# Patient Record
Sex: Female | Born: 1975 | Race: Black or African American | Hispanic: No | Marital: Single | Smoking: Current every day smoker
Health system: Southern US, Community
[De-identification: ages and names within clinical notes are randomized; demographics above are authoritative.]

## PROBLEM LIST (undated history)

## (undated) DIAGNOSIS — F419 Anxiety disorder, unspecified: Secondary | ICD-10-CM

## (undated) DIAGNOSIS — E669 Obesity, unspecified: Secondary | ICD-10-CM

## (undated) HISTORY — PX: CHOLECYSTECTOMY: SHX55

## (undated) HISTORY — PX: HERNIA REPAIR: SHX51

---

## 2015-07-03 ENCOUNTER — Encounter (HOSPITAL_COMMUNITY): Payer: Self-pay | Admitting: Emergency Medicine

## 2015-07-03 DIAGNOSIS — Z3202 Encounter for pregnancy test, result negative: Secondary | ICD-10-CM | POA: Diagnosis not present

## 2015-07-03 DIAGNOSIS — K5900 Constipation, unspecified: Secondary | ICD-10-CM | POA: Diagnosis not present

## 2015-07-03 DIAGNOSIS — K429 Umbilical hernia without obstruction or gangrene: Secondary | ICD-10-CM | POA: Diagnosis not present

## 2015-07-03 DIAGNOSIS — E669 Obesity, unspecified: Secondary | ICD-10-CM

## 2015-07-03 DIAGNOSIS — R109 Unspecified abdominal pain: Secondary | ICD-10-CM | POA: Insufficient documentation

## 2015-07-03 DIAGNOSIS — F17200 Nicotine dependence, unspecified, uncomplicated: Secondary | ICD-10-CM | POA: Insufficient documentation

## 2015-07-03 DIAGNOSIS — B349 Viral infection, unspecified: Secondary | ICD-10-CM | POA: Diagnosis not present

## 2015-07-03 LAB — CBC
HCT: 34.8 % — ABNORMAL LOW (ref 36.0–46.0)
Hemoglobin: 11 g/dL — ABNORMAL LOW (ref 12.0–15.0)
MCH: 26.5 pg (ref 26.0–34.0)
MCHC: 31.6 g/dL (ref 30.0–36.0)
MCV: 83.9 fL (ref 78.0–100.0)
PLATELETS: 508 10*3/uL — AB (ref 150–400)
RBC: 4.15 MIL/uL (ref 3.87–5.11)
RDW: 14.9 % (ref 11.5–15.5)
WBC: 12.8 10*3/uL — ABNORMAL HIGH (ref 4.0–10.5)

## 2015-07-03 LAB — COMPREHENSIVE METABOLIC PANEL
ALK PHOS: 52 U/L (ref 38–126)
ALT: 12 U/L — AB (ref 14–54)
AST: 18 U/L (ref 15–41)
Albumin: 3.6 g/dL (ref 3.5–5.0)
Anion gap: 8 (ref 5–15)
BILIRUBIN TOTAL: 0.4 mg/dL (ref 0.3–1.2)
BUN: <5 mg/dL — ABNORMAL LOW (ref 6–20)
CALCIUM: 9.5 mg/dL (ref 8.9–10.3)
CO2: 23 mmol/L (ref 22–32)
CREATININE: 0.71 mg/dL (ref 0.44–1.00)
Chloride: 103 mmol/L (ref 101–111)
Glucose, Bld: 127 mg/dL — ABNORMAL HIGH (ref 65–99)
Potassium: 3.9 mmol/L (ref 3.5–5.1)
SODIUM: 134 mmol/L — AB (ref 135–145)
Total Protein: 7.6 g/dL (ref 6.5–8.1)

## 2015-07-03 LAB — URINE MICROSCOPIC-ADD ON: WBC, UA: NONE SEEN WBC/hpf (ref 0–5)

## 2015-07-03 LAB — LIPASE, BLOOD: Lipase: 23 U/L (ref 11–51)

## 2015-07-03 LAB — URINALYSIS, ROUTINE W REFLEX MICROSCOPIC
BILIRUBIN URINE: NEGATIVE
Glucose, UA: NEGATIVE mg/dL
Ketones, ur: NEGATIVE mg/dL
Leukocytes, UA: NEGATIVE
Nitrite: NEGATIVE
PROTEIN: NEGATIVE mg/dL
Specific Gravity, Urine: 1.025 (ref 1.005–1.030)
pH: 6 (ref 5.0–8.0)

## 2015-07-03 LAB — POC URINE PREG, ED: PREG TEST UR: NEGATIVE

## 2015-07-03 NOTE — ED Notes (Signed)
Pt. reports mid abdominal pain onset yesterday , denies nausea or vomitting , no diarrhea or fever , pt. suspects pain stems from her hernia .

## 2015-07-04 ENCOUNTER — Emergency Department (HOSPITAL_COMMUNITY)
Admission: EM | Admit: 2015-07-04 | Discharge: 2015-07-04 | Disposition: A | Discharge status: Home / Self Care | Payer: Medicaid Other | Source: Home / Self Care

## 2015-07-04 ENCOUNTER — Encounter (HOSPITAL_COMMUNITY): Payer: Self-pay | Admitting: *Deleted

## 2015-07-04 DIAGNOSIS — B349 Viral infection, unspecified: Secondary | ICD-10-CM | POA: Insufficient documentation

## 2015-07-04 DIAGNOSIS — Z3202 Encounter for pregnancy test, result negative: Secondary | ICD-10-CM | POA: Insufficient documentation

## 2015-07-04 DIAGNOSIS — K429 Umbilical hernia without obstruction or gangrene: Secondary | ICD-10-CM | POA: Insufficient documentation

## 2015-07-04 DIAGNOSIS — K5900 Constipation, unspecified: Secondary | ICD-10-CM | POA: Insufficient documentation

## 2015-07-04 DIAGNOSIS — F17200 Nicotine dependence, unspecified, uncomplicated: Secondary | ICD-10-CM | POA: Insufficient documentation

## 2015-07-04 DIAGNOSIS — E669 Obesity, unspecified: Secondary | ICD-10-CM | POA: Insufficient documentation

## 2015-07-04 HISTORY — DX: Obesity, unspecified: E66.9

## 2015-07-04 LAB — LIPASE, BLOOD: Lipase: 28 U/L (ref 11–51)

## 2015-07-04 LAB — COMPREHENSIVE METABOLIC PANEL
ALK PHOS: 50 U/L (ref 38–126)
ALT: 12 U/L — ABNORMAL LOW (ref 14–54)
ANION GAP: 7 (ref 5–15)
AST: 16 U/L (ref 15–41)
Albumin: 3.6 g/dL (ref 3.5–5.0)
BILIRUBIN TOTAL: 0.4 mg/dL (ref 0.3–1.2)
BUN: <5 mg/dL — ABNORMAL LOW (ref 6–20)
CALCIUM: 9.3 mg/dL (ref 8.9–10.3)
CO2: 26 mmol/L (ref 22–32)
Chloride: 103 mmol/L (ref 101–111)
Creatinine, Ser: 0.73 mg/dL (ref 0.44–1.00)
GFR calc non Af Amer: >60 mL/min (ref >60)
Glucose, Bld: 116 mg/dL — ABNORMAL HIGH (ref 65–99)
Potassium: 3.7 mmol/L (ref 3.5–5.1)
Sodium: 136 mmol/L (ref 135–145)
TOTAL PROTEIN: 7.7 g/dL (ref 6.5–8.1)

## 2015-07-04 LAB — CBC
HCT: 35 % — ABNORMAL LOW (ref 36.0–46.0)
HEMOGLOBIN: 11.1 g/dL — AB (ref 12.0–15.0)
MCH: 26.9 pg (ref 26.0–34.0)
MCHC: 31.7 g/dL (ref 30.0–36.0)
MCV: 84.7 fL (ref 78.0–100.0)
Platelets: 476 10*3/uL — ABNORMAL HIGH (ref 150–400)
RBC: 4.13 MIL/uL (ref 3.87–5.11)
RDW: 14.8 % (ref 11.5–15.5)
WBC: 9.2 10*3/uL (ref 4.0–10.5)

## 2015-07-04 LAB — URINE MICROSCOPIC-ADD ON

## 2015-07-04 LAB — URINALYSIS, ROUTINE W REFLEX MICROSCOPIC
BILIRUBIN URINE: NEGATIVE
Glucose, UA: NEGATIVE mg/dL
Ketones, ur: NEGATIVE mg/dL
LEUKOCYTES UA: NEGATIVE
NITRITE: NEGATIVE
Protein, ur: NEGATIVE mg/dL
SPECIFIC GRAVITY, URINE: 1.026 (ref 1.005–1.030)
pH: 7 (ref 5.0–8.0)

## 2015-07-04 NOTE — ED Notes (Signed)
Pts name called for a room no answer 

## 2015-07-04 NOTE — ED Notes (Signed)
Pt c/o abd pain x 2 days. Pt think its her hernia. Hx of hernia repaired. Was here to be seen yesterday but wait was too long.

## 2015-07-05 ENCOUNTER — Emergency Department (HOSPITAL_COMMUNITY)
Admission: EM | Admit: 2015-07-05 | Discharge: 2015-07-05 | Disposition: A | Discharge status: Home / Self Care | Payer: Medicaid Other | Attending: Emergency Medicine | Admitting: Emergency Medicine

## 2015-07-05 ENCOUNTER — Encounter (HOSPITAL_COMMUNITY): Payer: Self-pay | Admitting: Radiology

## 2015-07-05 ENCOUNTER — Emergency Department (HOSPITAL_COMMUNITY): Payer: Medicaid Other

## 2015-07-05 DIAGNOSIS — B349 Viral infection, unspecified: Secondary | ICD-10-CM

## 2015-07-05 DIAGNOSIS — R1033 Periumbilical pain: Secondary | ICD-10-CM

## 2015-07-05 DIAGNOSIS — K429 Umbilical hernia without obstruction or gangrene: Secondary | ICD-10-CM

## 2015-07-05 LAB — PREGNANCY, URINE: PREG TEST UR: NEGATIVE

## 2015-07-05 MED ORDER — PROCHLORPERAZINE EDISYLATE 5 MG/ML IJ SOLN
10.0000 mg | Freq: Four times a day (QID) | INTRAMUSCULAR | Status: DC | PRN
  Administered 2015-07-05: 10 mg via INTRAVENOUS
  Filled 2015-07-05: qty 2

## 2015-07-05 MED ORDER — DIPHENHYDRAMINE HCL 50 MG/ML IJ SOLN
25.0000 mg | Freq: Once | INTRAMUSCULAR | Status: AC
  Administered 2015-07-05: 25 mg via INTRAVENOUS
  Filled 2015-07-05: qty 1

## 2015-07-05 MED ORDER — SODIUM CHLORIDE 0.9 % IV BOLUS (SEPSIS)
1000.0000 mL | Freq: Once | INTRAVENOUS | Status: AC
  Administered 2015-07-05: 1000 mL via INTRAVENOUS

## 2015-07-05 MED ORDER — FENTANYL CITRATE (PF) 100 MCG/2ML IJ SOLN
50.0000 ug | Freq: Once | INTRAMUSCULAR | Status: AC
  Administered 2015-07-05: 50 ug via INTRAVENOUS
  Filled 2015-07-05: qty 2

## 2015-07-05 MED ORDER — ONDANSETRON HCL 4 MG/2ML IJ SOLN
4.0000 mg | Freq: Once | INTRAMUSCULAR | Status: AC
Start: 2015-07-05 — End: 2015-07-05
  Administered 2015-07-05: 4 mg via INTRAVENOUS
  Filled 2015-07-05: qty 2

## 2015-07-05 MED ORDER — IOHEXOL 300 MG/ML  SOLN
100.0000 mL | Freq: Once | INTRAMUSCULAR | Status: AC | PRN
  Administered 2015-07-05: 100 mL via INTRAVENOUS

## 2015-07-05 NOTE — Discharge Instructions (Signed)

## 2015-07-05 NOTE — ED Provider Notes (Signed)
CSN: [id number]     Arrival date & time 07/04/15  2131 History  By signing my name below, I, Rohini Rajnarayanan, attest that this documentation has been prepared under the direction and in the presence of Alvira MondayErin Seanmichael Salmons, MD Electronically Signed: Charlean Merlohini Rajnarayanan, ED Scribe 07/05/2015 at 3:04 AM.  Chief Complaint  Patient presents with  . Abdominal Pain   The history is provided by the patient. No language interpreter was used.    HPI Comments: Destiny White is a 40 y.o. female with a pmshx of cholecystectomy, and a hernia repair, who presents to the Emergency Department complaining of intermittent, throbbing, 8/10 mid-abdominal pain with keloids onset 2 days ago. Pt also reports associated HA onset earlier today, chills, cough, and a potential subjective fever. Pt has not had a bowel movement today. Pain is exacerbated by standing up. Pt has a hx of a hernia repair, and admits that sx feel similar to those experienced with the previous abscesses, throbbing pain. Pt denies any N/V/D, dysuria, hematuria, vaginal bleeding or discharge, melena, hematochezia, sore throat, ear pain, or rhinorrhea. Pt's LNMP was on March 5.    Past Medical History  Diagnosis Date  . Obesity    Past Surgical History  Procedure Laterality Date  . Cholecystectomy    . Cesarean section    . Hernia repair     No family history on file. Social History  Substance Use Topics  . Smoking status: Current Every Day Smoker  . Smokeless tobacco: None  . Alcohol Use: Yes   OB History    No data available     Review of Systems  Constitutional: Positive for fever and chills.  HENT: Negative for ear pain, rhinorrhea and sore throat.   Eyes: Negative for visual disturbance.  Respiratory: Positive for cough. Negative for shortness of breath.   Cardiovascular: Negative for chest pain.  Gastrointestinal: Positive for abdominal pain and constipation. Negative for nausea, vomiting, diarrhea and blood in stool.   Genitourinary: Negative for hematuria, vaginal bleeding, vaginal discharge and difficulty urinating.  Musculoskeletal: Negative for back pain and neck pain.  Skin: Negative for rash.  Neurological: Positive for headaches. Negative for syncope.    Allergies  Morphine and Morphine and related  Home Medications   Prior to Admission medications   Medication Sig Start Date End Date Taking? Authorizing Provider  Multiple Vitamin (MULTIVITAMIN WITH MINERALS) TABS tablet Take 1 tablet by mouth daily.   Yes Historical Provider, MD   BP 127/72 mmHg  Pulse 109  Temp(Src) 100.1 F (37.8 C) (Oral)  Resp 22  SpO2 100%  LMP 05/20/2015 Physical Exam  Constitutional: She is oriented to person, place, and time. She appears well-developed and well-nourished. No distress.  HENT:  Head: Normocephalic and atraumatic.  Eyes: Conjunctivae and EOM are normal.  Neck: Normal range of motion. Neck supple.  Cardiovascular: Normal rate, regular rhythm, normal heart sounds and intact distal pulses.  Exam reveals no gallop and no friction rub.   No murmur heard. Pulmonary/Chest: Effort normal and breath sounds normal. No respiratory distress. She has no wheezes. She has no rales.  Abdominal: Soft. She exhibits no distension. There is no tenderness. There is no guarding.  Periumbilical TTP. Exam limited in the supine position by keloid presence. No sign of incarcerated hernia in supine position. However protrusion of hernia in pt standing upright, which appears reducible however exam limited  Musculoskeletal: She exhibits no edema or tenderness.  Neurological: She is alert and oriented to person, place, and  time.  Skin: Skin is warm and dry. No rash noted. She is not diaphoretic. No erythema.  Nursing note and vitals reviewed.   ED Course  Procedures  DIAGNOSTIC STUDIES: Oxygen Saturation is 100% on Ra, normal by my interpretation.    COORDINATION OF CARE: 2:25 AM-Discussed treatment plan which  includes UA, urine pregnancy test, blood work, and CT A/P  with pt at bedside and pt agreed to plan.   Labs Review Labs Reviewed  COMPREHENSIVE METABOLIC PANEL - Abnormal; Notable for the following:    Glucose, Bld 116 (*)    BUN <5 (*)    ALT 12 (*)    All other components within normal limits  CBC - Abnormal; Notable for the following:    Hemoglobin 11.1 (*)    HCT 35.0 (*)    Platelets 476 (*)    All other components within normal limits  URINALYSIS, ROUTINE W REFLEX MICROSCOPIC (NOT AT [facility]) - Abnormal; Notable for the following:    APPearance CLOUDY (*)    Hgb urine dipstick SMALL (*)    All other components within normal limits  URINE MICROSCOPIC-ADD ON - Abnormal; Notable for the following:    Squamous Epithelial / LPF 0-5 (*)    Bacteria, UA RARE (*)    All other components within normal limits  LIPASE, BLOOD    Imaging Review No results found. I have personally reviewed and evaluated these images and lab results as part of my medical decision-making.   EKG Interpretation None      MDM   Final diagnoses:  None    40yo female with history of obseity, cholecystectomy, umbilical hernia s/p repair, keloids, presents with concern for abdominal pain. Patient also reports subjective fever, chills, mild cough.  CT abdomen shows signs of herniated fat, no stranding, no obstruction.  On reexamination there are no clinical signs of incarceration.  Patient with mild cough, lower lungs WNL on ct, and pt without tachypnea, no hypoxia, normal oxygen saturation and good breath sounds bilaterally and have low suspicion for pneumonia. Pt with headache that began slowly, doubt SAH. No meningeal signs.  Given compazine/benadryl for headache with imrpovement. Temp 100.1, and given constellation of symptoms, pt may have viral syndrome in addition to abdominal pain secondary to hernia.  Recommend outpt surgery follow up--recommend [facility] given pt had previous surgery there, however provided  number for [facility] if pt interested in transfer of care. Patient discharged in stable condition with understanding of reasons to return.      I personally performed the services described in this documentation, which was scribed in my presence. The recorded information has been reviewed and is accurate.     Alvira Monday, MD 07/05/15 1734

## 2015-07-25 ENCOUNTER — Encounter (HOSPITAL_COMMUNITY): Payer: Self-pay | Admitting: *Deleted

## 2015-07-25 ENCOUNTER — Emergency Department (HOSPITAL_COMMUNITY)
Admission: EM | Admit: 2015-07-25 | Discharge: 2015-07-25 | Disposition: A | Discharge status: Home / Self Care | Payer: Medicaid Other | Attending: Emergency Medicine | Admitting: Emergency Medicine

## 2015-07-25 DIAGNOSIS — F17200 Nicotine dependence, unspecified, uncomplicated: Secondary | ICD-10-CM | POA: Diagnosis not present

## 2015-07-25 DIAGNOSIS — Z79899 Other long term (current) drug therapy: Secondary | ICD-10-CM | POA: Diagnosis not present

## 2015-07-25 DIAGNOSIS — L0291 Cutaneous abscess, unspecified: Secondary | ICD-10-CM

## 2015-07-25 DIAGNOSIS — N611 Abscess of the breast and nipple: Secondary | ICD-10-CM | POA: Insufficient documentation

## 2015-07-25 DIAGNOSIS — R110 Nausea: Secondary | ICD-10-CM | POA: Diagnosis not present

## 2015-07-25 DIAGNOSIS — E669 Obesity, unspecified: Secondary | ICD-10-CM | POA: Diagnosis not present

## 2015-07-25 MED ORDER — LIDOCAINE-EPINEPHRINE 1 %-1:100000 IJ SOLN
10.0000 mL | Freq: Once | INTRAMUSCULAR | Status: AC
  Administered 2015-07-25: 10 mL
  Filled 2015-07-25: qty 1

## 2015-07-25 MED ORDER — SULFAMETHOXAZOLE-TRIMETHOPRIM 800-160 MG PO TABS: 1.0000 | ORAL_TABLET | Freq: Two times a day (BID) | ORAL | Status: AC

## 2015-07-25 MED ORDER — OXYCODONE-ACETAMINOPHEN 5-325 MG PO TABS
1.0000 | ORAL_TABLET | Freq: Once | ORAL | Status: AC
  Administered 2015-07-25: 1 via ORAL
  Filled 2015-07-25: qty 1

## 2015-07-25 NOTE — ED Notes (Signed)
I&D and lidocaine at bedside

## 2015-07-25 NOTE — ED Notes (Signed)
Pt states that she thinks she has a hernia under her left breast. Reports hx of the same with it needing to be drained.

## 2015-07-25 NOTE — ED Provider Notes (Signed)
CSN: [id number]     Arrival date & time 07/25/15  2113 History   By signing my name below, I, Destiny White, attest that this documentation has been prepared under the direction and in the presence of [employer], PA-C. Electronically Signed: Tanda White, ED Scribe. 07/25/2015. 10:17 PM.   Chief Complaint  Patient presents with  . Abscess   The history is provided by the patient. No language interpreter was used.     HPI Comments: Destiny White is a 40 y.o. female who presents to the Emergency Department complaining of gradual onset, constant, area of swelling, redness, and pain to the left breast x 2 days. No drainage to the area. She has been applying warm compresses without relief. Pt reports abscess to the same area in the past that needed to be drained. She also complains of nausea from the pain. Denies fever, chills, or any other associated symptoms.   Past Medical History  Diagnosis Date  . Obesity    Past Surgical History  Procedure Laterality Date  . Cholecystectomy    . Cesarean section    . Hernia repair     No family history on file. Social History  Substance Use Topics  . Smoking status: Current Every Day Smoker  . Smokeless tobacco: None  . Alcohol Use: Yes   OB History    No data available     Review of Systems  Constitutional: Negative for fever and chills.  Skin:       + redness, swelling, and pain to left breast Negative for drainage  All other systems reviewed and are negative.  Allergies  Morphine and Morphine and related  Home Medications   Prior to Admission medications   Medication Sig Start Date End Date Taking? Authorizing Provider  Multiple Vitamin (MULTIVITAMIN WITH MINERALS) TABS tablet Take 1 tablet by mouth daily.    Historical Provider, MD  sulfamethoxazole-trimethoprim (BACTRIM DS,SEPTRA DS) 800-160 MG tablet Take 1 tablet by mouth 2 (two) times daily. 07/25/15 08/01/15  Tinnie Gens Blane Worthington, PA-C   BP 141/81 mmHg  Pulse 112   Temp(Src) 97.9 F (36.6 C) (Oral)  Resp 18  SpO2 96%  LMP 07/25/2015   Physical Exam  Constitutional: She is oriented to person, place, and time. She appears well-developed and well-nourished. No distress.  HENT:  Head: Normocephalic and atraumatic.  Eyes: Conjunctivae and EOM are normal.  Neck: Neck supple. No tracheal deviation present.  Cardiovascular: Normal rate.   Pulmonary/Chest: Effort normal. No respiratory distress.  Musculoskeletal: Normal range of motion.  Neurological: She is alert and oriented to person, place, and time.  Skin: Skin is warm and dry.  Abscess to the area between breasts at very bottom of left breast. No significant surrounding signs of cellulitis.   Psychiatric: She has a normal mood and affect. Her behavior is normal.  Nursing note and vitals reviewed.   ED Course  Procedures (including critical care time)  EMERGENCY DEPARTMENT US SOFT TISSUE INTERPRETATION "Study: Limited Ultrasound of the noted body part in comments below"  INDICATIONS: Soft tissue infection Multiple views of the body part are obtained with a multi-frequency linear probe  PERFORMED BY:  Myself  IMAGES ARCHIVED?: Yes  SIDE:Left  BODY PART:Breast  FINDINGS: Abcess present  LIMITATIONS:  Body Habitus  INTERPRETATION:  Abcess present    INCISION AND DRAINAGE PROCEDURE NOTE: Patient identification was confirmed and verbal consent was obtained. This procedure was performed by Burna Forts, PA-C at 10:55 PM. Site: Left breast Sterile procedures observed  Needle size: 25 Anesthetic used (type and amt): Lidocaine with epi Blade size: 11 Drainage: Purulent 3 mL Complexity: Complex Packing used none Site anesthetized, incision made over site, wound drained and explored loculations, rinsed with copious amounts of normal saline, wound packed with sterile gauze, covered with dry, sterile dressing.  Pt tolerated procedure well without complications.  Instructions for  care discussed verbally and pt provided with additional written instructions for homecare and f/u.  DIAGNOSTIC STUDIES: Oxygen Saturation is 96% on RA, normal by my interpretation.    COORDINATION OF CARE: 9:54 PM-Discussed treatment plan which includes I&D with pt at bedside and pt agreed to plan.   Labs Review Labs Reviewed - No data to display  Imaging Review No results found.   EKG Interpretation None      MDM   Final diagnoses:  Abscess    Labs:  Imaging:  Consults:  Therapeutics:With epi, Percocet  Discharge Meds: Bactrim  Assessment/Plan:40 year old female presents today with an abscess under her left breast. She had no signs of surrounding cellulitis, this was a significant sized abscess proximally 2 cm. Ultrasound showed a well-circumscribed abscess with no signs of deep space infectionI personally performed the services described in this documentation, which was scribed in my presence. The recorded information has been reviewed and is accurate. Due to patient's localized abscess, no systemic complaints, no significant cellulitis I did perform an I&D here in the ED. I do not make a wide incision, approximately 0.75 cm which was more than adequate to drain the abscess. Patient will be placed on antibiotics as this is a significant sized abscess, with history recurrence. Patient will be instructed to follow up with her primary care or the ED in 3 days for reevaluation. She is instructed to return immediately if any signs of infection present. Patient verbalized understanding and agreement today's plan had no further questions or concerns at time of discharge     I personally performed the services described in this documentation, which was scribed in my presence. The recorded information has been reviewed and is accurate.     Eyvonne MechanicJeffrey Regina Coppolino, PA-C 07/25/15 2255  Doug SouSam Jacubowitz, MD 07/26/15 [phone]

## 2015-07-25 NOTE — Discharge Instructions (Signed)
Please take Anaprox as directed, please follow-up with your primary care provider or the ED in 3 days for reevaluation of wound. If any signs of infection present please return immediately to the ED.  Abscess An abscess is an infected area that contains a collection of pus and debris.It can occur in almost any part of the body. An abscess is also known as a furuncle or boil. CAUSES  An abscess occurs when tissue gets infected. This can occur from blockage of oil or sweat glands, infection of hair follicles, or a minor injury to the skin. As the body tries to fight the infection, pus collects in the area and creates pressure under the skin. This pressure causes pain. People with weakened immune systems have difficulty fighting infections and get certain abscesses more often.  SYMPTOMS Usually an abscess develops on the skin and becomes a painful mass that is red, warm, and tender. If the abscess forms under the skin, you may feel a moveable soft area under the skin. Some abscesses break open (rupture) on their own, but most will continue to get worse without care. The infection can spread deeper into the body and eventually into the bloodstream, causing you to feel ill.  DIAGNOSIS  Your caregiver will take your medical history and perform a physical exam. A sample of fluid may also be taken from the abscess to determine what is causing your infection. TREATMENT  Your caregiver may prescribe antibiotic medicines to fight the infection. However, taking antibiotics alone usually does not cure an abscess. Your caregiver may need to make a small cut (incision) in the abscess to drain the pus. In some cases, gauze is packed into the abscess to reduce pain and to continue draining the area. HOME CARE INSTRUCTIONS   Only take over-the-counter or prescription medicines for pain, discomfort, or fever as directed by your caregiver.  If you were prescribed antibiotics, take them as directed. Finish them even if  you start to feel better.  If gauze is used, follow your caregiver's directions for changing the gauze.  To avoid spreading the infection:  Keep your draining abscess covered with a bandage.  Wash your hands well.  Do not share personal care items, towels, or whirlpools with others.  Avoid skin contact with others.  Keep your skin and clothes clean around the abscess.  Keep all follow-up appointments as directed by your caregiver. SEEK MEDICAL CARE IF:   You have increased pain, swelling, redness, fluid drainage, or bleeding.  You have muscle aches, chills, or a general ill feeling.  You have a fever. MAKE SURE YOU:   Understand these instructions.  Will watch your condition.  Will get help right away if you are not doing well or get worse.   This information is not intended to replace advice given to you by your health care provider. Make sure you discuss any questions you have with your health care provider.   Document Released: 01/08/2005 Document Revised: 09/30/2011 Document Reviewed: 06/13/2011 Elsevier Interactive Patient Education [employer]2016 Elsevier Inc.

## 2015-07-25 NOTE — ED Notes (Signed)
See EDP assessment 

## 2015-09-02 ENCOUNTER — Encounter (HOSPITAL_COMMUNITY): Payer: Self-pay | Admitting: Emergency Medicine

## 2015-09-02 ENCOUNTER — Emergency Department (HOSPITAL_COMMUNITY): Payer: Medicaid Other

## 2015-09-02 ENCOUNTER — Emergency Department (HOSPITAL_COMMUNITY)
Admission: EM | Admit: 2015-09-02 | Discharge: 2015-09-02 | Disposition: A | Discharge status: Home / Self Care | Payer: Medicaid Other | Attending: Emergency Medicine | Admitting: Emergency Medicine

## 2015-09-02 DIAGNOSIS — E669 Obesity, unspecified: Secondary | ICD-10-CM | POA: Insufficient documentation

## 2015-09-02 DIAGNOSIS — F17200 Nicotine dependence, unspecified, uncomplicated: Secondary | ICD-10-CM | POA: Insufficient documentation

## 2015-09-02 DIAGNOSIS — R0789 Other chest pain: Secondary | ICD-10-CM | POA: Diagnosis present

## 2015-09-02 DIAGNOSIS — M549 Dorsalgia, unspecified: Secondary | ICD-10-CM | POA: Insufficient documentation

## 2015-09-02 HISTORY — DX: Anxiety disorder, unspecified: F41.9

## 2015-09-02 LAB — I-STAT TROPONIN, ED
TROPONIN I, POC: 0 ng/mL (ref 0.00–0.08)
Troponin i, poc: 0 ng/mL (ref 0.00–0.08)

## 2015-09-02 LAB — BASIC METABOLIC PANEL
ANION GAP: 10 (ref 5–15)
BUN: 9 mg/dL (ref 6–20)
CALCIUM: 8.6 mg/dL — AB (ref 8.9–10.3)
CO2: 22 mmol/L (ref 22–32)
Chloride: 103 mmol/L (ref 101–111)
Creatinine, Ser: 0.59 mg/dL (ref 0.44–1.00)
GFR calc Af Amer: >60 mL/min (ref >60)
GLUCOSE: 127 mg/dL — AB (ref 65–99)
Potassium: 4.1 mmol/L (ref 3.5–5.1)
SODIUM: 135 mmol/L (ref 135–145)

## 2015-09-02 LAB — CBC
HCT: 34.6 % — ABNORMAL LOW (ref 36.0–46.0)
Hemoglobin: 10.7 g/dL — ABNORMAL LOW (ref 12.0–15.0)
MCH: 25.8 pg — ABNORMAL LOW (ref 26.0–34.0)
MCHC: 30.9 g/dL (ref 30.0–36.0)
MCV: 83.6 fL (ref 78.0–100.0)
Platelets: 431 10*3/uL — ABNORMAL HIGH (ref 150–400)
RBC: 4.14 MIL/uL (ref 3.87–5.11)
RDW: 14.4 % (ref 11.5–15.5)
WBC: 8.9 10*3/uL (ref 4.0–10.5)

## 2015-09-02 LAB — D-DIMER, QUANTITATIVE: D-Dimer, Quant: <0.27 ug/mL-FEU (ref 0.00–0.50)

## 2015-09-02 MED ORDER — ASPIRIN 81 MG PO CHEW
324.0000 mg | CHEWABLE_TABLET | Freq: Once | ORAL | Status: DC
  Filled 2015-09-02: qty 4

## 2015-09-02 MED ORDER — OXYCODONE-ACETAMINOPHEN 5-325 MG PO TABS
1.0000 | ORAL_TABLET | Freq: Once | ORAL | Status: AC
  Administered 2015-09-02: 1 via ORAL
  Filled 2015-09-02: qty 1

## 2015-09-02 NOTE — Discharge Instructions (Signed)
Ibuprofen or tylenol for pain. Follow up with your primary care doctor or call the number provided to establish a family doctor. Return if worsening.    Chest Wall Pain Chest wall pain is pain in or around the bones and muscles of your chest. Sometimes, an injury causes this pain. Sometimes, the cause may not be known. This pain may take several weeks or longer to get better. HOME CARE INSTRUCTIONS  Pay attention to any changes in your symptoms. Take these actions to help with your pain:   Rest as told by your health care provider.   Avoid activities that cause pain. These include any activities that use your chest muscles or your abdominal and side muscles to lift heavy items.   If directed, apply ice to the painful area:  Put ice in a plastic bag.  Place a towel between your skin and the bag.  Leave the ice on for 20 minutes, 2-3 times per day.  Take over-the-counter and prescription medicines only as told by your health care provider.  Do not use tobacco products, including cigarettes, chewing tobacco, and e-cigarettes. If you need help quitting, ask your health care provider.  Keep all follow-up visits as told by your health care provider. This is important. SEEK MEDICAL CARE IF:  You have a fever.  Your chest pain becomes worse.  You have new symptoms. SEEK IMMEDIATE MEDICAL CARE IF:  You have nausea or vomiting.  You feel sweaty or light-headed.  You have a cough with phlegm (sputum) or you cough up blood.  You develop shortness of breath.   This information is not intended to replace advice given to you by your health care provider. Make sure you discuss any questions you have with your health care provider.   Document Released: 03/31/2005 Document Revised: 12/20/2014 Document Reviewed: 06/26/2014 Elsevier Interactive Patient Education [employer] Elsevier Inc.

## 2015-09-02 NOTE — ED Notes (Signed)
Pt arrives via EMS with CP x1 hour, upper R chest radiating to back. States began when bending down to pick up something. Worse with palpation, movement, nitro not effective for pain. ASA on board. Denies dizziness, nausea, SHOB. Hx anxiety.  Ambulatory and alert to treatment room

## 2015-09-02 NOTE — ED Provider Notes (Signed)
CSN: [id number]     Arrival date & time 09/02/15  0547 History   First MD Initiated Contact with Patient 09/02/15 [phone]     Chief Complaint  Patient presents with  . Chest Pain     (Consider location/radiation/quality/duration/timing/severity/associated sxs/prior Treatment) HPI Destiny White is a 40 y.o. female with history of anxiety and obesity, presents to emergency department complaining of chest pain and back pain. Patient states she woke up this morning and went to the bathroom. She states that she was walking out of the bathroom she then down and felt sharp pain in her upper back between her shoulder blades. She states that she noticed an pain radiating into her chest. She called EMS. She was given aspirin by EMS and nitroglycerin which did not help. Patient states that she is not having any shortness of breath, dizziness, nausea, diaphoresis. She does have history of anxiety. She states pain is worsened when she sits up and with any movement. Denies any history of heart problems or lung problems. She states she is scared because her aunt died from a blood clot in her lung.   Past Medical History  Diagnosis Date  . Obesity   . Anxiety    Past Surgical History  Procedure Laterality Date  . Cholecystectomy    . Cesarean section    . Hernia repair     No family history on file. Social History  Substance Use Topics  . Smoking status: Current Every Day Smoker  . Smokeless tobacco: None  . Alcohol Use: Yes   OB History    No data available     Review of Systems  Constitutional: Negative for fever and chills.  Respiratory: Positive for chest tightness. Negative for cough and shortness of breath.   Cardiovascular: Positive for chest pain. Negative for palpitations and leg swelling.  Gastrointestinal: Negative for nausea, vomiting, abdominal pain and diarrhea.  Genitourinary: Negative for dysuria, flank pain and pelvic pain.  Musculoskeletal: Positive for back pain. Negative  for myalgias, arthralgias, neck pain and neck stiffness.  Skin: Negative for rash.  Neurological: Negative for dizziness, weakness and headaches.  All other systems reviewed and are negative.     Allergies  Morphine and Morphine and related  Home Medications   Prior to Admission medications   Not on File   BP 104/77 mmHg  Pulse 79  Temp(Src) 98 F (36.7 C) (Oral)  Resp 10  Ht  (1.753 m)  Wt 131.543 kg  BMI 42.81 kg/m2  SpO2 100%  LMP 08/13/2015 (Exact Date) Physical Exam  Constitutional: She appears well-developed and well-nourished. No distress.  HENT:  Head: Normocephalic.  Eyes: Conjunctivae are normal.  Neck: Neck supple.  Cardiovascular: Normal rate, regular rhythm and normal heart sounds.   Pulmonary/Chest: Effort normal and breath sounds normal. No respiratory distress. She has no wheezes. She has no rales. She exhibits tenderness.  Tenderness over center of the chest over the sternum  Abdominal: Soft. Bowel sounds are normal. She exhibits no distension. There is no tenderness. There is no rebound.  Musculoskeletal: She exhibits no edema.  Neurological: She is alert.  Skin: Skin is warm and dry.  Psychiatric: She has a normal mood and affect. Her behavior is normal.  Nursing note and vitals reviewed.   ED Course  Procedures (including critical care time) Labs Review Labs Reviewed  BASIC METABOLIC PANEL - Abnormal; Notable for the following:    Glucose, Bld 127 (*)    Calcium 8.6 (*)  All other components within normal limits  CBC - Abnormal; Notable for the following:    Hemoglobin 10.7 (*)    HCT 34.6 (*)    MCH 25.8 (*)    Platelets 431 (*)    All other components within normal limits  D-DIMER, QUANTITATIVE (NOT AT [facility])  Rosezena SensorI-STAT TROPOININ, ED    Imaging Review Dg Chest 2 View  09/02/2015  CLINICAL DATA:  Chest pain EXAM: CHEST  2 VIEW COMPARISON:  None. FINDINGS: Normal heart size. Normal mediastinal contour. No pneumothorax. No pleural  effusion. Lungs appear clear, with no acute consolidative airspace disease and no pulmonary edema. IMPRESSION: No active cardiopulmonary disease. Electronically Signed   By: Delbert PhenixJason A Poff M.D.   On: 09/02/2015 07:37   I have personally reviewed and evaluated these images and lab results as part of my medical decision-making.   EKG Interpretation   Date/Time:  Sunday Sep 02 2015 05:51:56 EDT Ventricular Rate:  78 PR Interval:  144 QRS Duration: 89 QT Interval:  388 QTC Calculation: 442 R Axis:   35 Text Interpretation:  Sinus rhythm Low voltage, precordial leads Probable  anteroseptal infarct, old Borderline T abnormalities, inferior leads No  old tracing to compare Confirmed by WARD,  DO, KRISTEN ([id number]) on  09/02/2015 6:05:33 AM      MDM   Final diagnoses:  Chest wall pain   Pt with chest/back pain. Received asa and nitro by ems which did not help. Pt's symptoms atypical. She has equal DP pulses bilaterally. Chest wall is tender to palpation. Pt is low risk for cardiac disease,  Risk factors include obesity and smoking. Will also get d dimer  Labs negative including d dimer and trop. Will get delta trop  10:10 AM Delta trop negative. Pt heart pathway score is 2. Low risk. Will dc home with pcp follow up as soon as able. Return precautions discussed.    Filed Vitals:   09/02/15 0553 09/02/15 0630 09/02/15 0745 09/02/15 0830  BP: 101/63 98/65 104/77 112/75  Pulse: 83 77 79 73  Temp: 98 F (36.7 C)     TempSrc: Oral     Resp: 11 10    Height: 5\' 9"  (1.753 m)     Weight: 131.543 kg     SpO2: 100% 100% 100% 100%     Jaynie Crumbleatyana Inika Bellanger, PA-C 09/02/15 1053  Gerhard Munchobert Lockwood, MD 09/02/15 1105

## 2015-12-06 ENCOUNTER — Encounter (HOSPITAL_COMMUNITY): Payer: Self-pay

## 2015-12-06 ENCOUNTER — Emergency Department (HOSPITAL_COMMUNITY)
Admission: EM | Admit: 2015-12-06 | Discharge: 2015-12-06 | Disposition: A | Discharge status: Home / Self Care | Payer: Medicaid Other | Attending: Emergency Medicine | Admitting: Emergency Medicine

## 2015-12-06 DIAGNOSIS — F17200 Nicotine dependence, unspecified, uncomplicated: Secondary | ICD-10-CM | POA: Insufficient documentation

## 2015-12-06 DIAGNOSIS — L02212 Cutaneous abscess of back [any part, except buttock]: Secondary | ICD-10-CM | POA: Insufficient documentation

## 2015-12-06 DIAGNOSIS — L0291 Cutaneous abscess, unspecified: Secondary | ICD-10-CM

## 2015-12-06 DIAGNOSIS — L02211 Cutaneous abscess of abdominal wall: Secondary | ICD-10-CM | POA: Diagnosis present

## 2015-12-06 MED ORDER — SULFAMETHOXAZOLE-TRIMETHOPRIM 800-160 MG PO TABS: 1.0000 | ORAL_TABLET | Freq: Two times a day (BID) | ORAL | 0 refills | Status: AC

## 2015-12-06 MED ORDER — CEPHALEXIN 500 MG PO CAPS: 500.0000 mg | ORAL_CAPSULE | Freq: Four times a day (QID) | ORAL | 0 refills | Status: AC

## 2015-12-06 NOTE — ED Provider Notes (Signed)
[facility] DEPT Provider Note   CSN: [id number] Arrival date & time: 12/06/15  4098     History   Chief Complaint Chief Complaint  Patient presents with  . Abscess    HPI Destiny White is a 40 y.o. female.  The history is provided by the patient. No language interpreter was used.  Abscess  Location:  Shoulder/arm and torso Torso abscess location:  Lower back and abd RUQ Size:  2 Abscess quality: draining   Red streaking: no   Progression:  Worsening Chronicity:  New Context: skin injury   Context: not insect bite/sting   Relieved by:  Nothing Worsened by:  Nothing Ineffective treatments:  None tried Associated symptoms: no nausea and no vomiting     Past Medical History:  Diagnosis Date  . Anxiety   . Obesity     There are no active problems to display for this patient.   Past Surgical History:  Procedure Laterality Date  . CESAREAN SECTION    . CHOLECYSTECTOMY    . HERNIA REPAIR      OB History    No data available       Home Medications    Prior to Admission medications   Medication Sig Start Date End Date Taking? Authorizing Provider  cephALEXin (KEFLEX) 500 MG capsule Take 1 capsule (500 mg total) by mouth 4 (four) times daily. 12/06/15   Elson Areas, PA-C  sulfamethoxazole-trimethoprim (BACTRIM DS,SEPTRA DS) 800-160 MG tablet Take 1 tablet by mouth 2 (two) times daily. 12/06/15 12/13/15  Elson Areas, PA-C    Family History History reviewed. No pertinent family history.  Social History Social History  Substance Use Topics  . Smoking status: Current Every Day Smoker  . Smokeless tobacco: Never Used  . Alcohol use Yes     Allergies   Morphine and Morphine and related   Review of Systems Review of Systems  Gastrointestinal: Negative for nausea and vomiting.  All other systems reviewed and are negative.    Physical Exam Updated Vital Signs BP 118/79 (BP Location: Left Arm)   Pulse 97   Temp 98.1 F (36.7 C) (Oral)    Resp 18   LMP 10/15/2015 (Approximate)   SpO2 99%   Physical Exam  Constitutional: She appears well-developed and well-nourished.  HENT:  Head: Normocephalic.  Eyes: Pupils are equal, round, and reactive to light.  Neck: Normal range of motion.  Cardiovascular: Normal rate.   Pulmonary/Chest: Effort normal.  Abdominal: Soft.  Musculoskeletal: She exhibits tenderness.  Neurological: She is alert.  Skin: There is erythema.  Psychiatric: She has a normal mood and affect.  Nursing note and vitals reviewed.  Both area are draining.1cm area at umbilicus.  Drainage from pustlule on bottom  ED Treatments / Results  Labs (all labs ordered are listed, but only abnormal results are displayed) Labs Reviewed - No data to display  EKG  EKG Interpretation None       Radiology No results found.  Procedures Procedures (including critical care time)  Medications Ordered in ED Medications - No data to display   Initial Impression / Assessment and Plan / ED Course  I have reviewed the triage vital signs and the nursing notes.  Pertinent labs & imaging results that were available during my care of the patient were reviewed by me and considered in my medical decision making (see chart for details).  Clinical Course    Pt given antibiotics, pat advised tylenol.  Warm compresses  Final Clinical Impressions(s) /  ED Diagnoses   Final diagnoses:  Abscess    New Prescriptions New Prescriptions   CEPHALEXIN (KEFLEX) 500 MG CAPSULE    Take 1 capsule (500 mg total) by mouth 4 (four) times daily.   SULFAMETHOXAZOLE-TRIMETHOPRIM (BACTRIM DS,SEPTRA DS) 800-160 MG TABLET    Take 1 tablet by mouth 2 (two) times daily.  An After Visit Summary was printed and given to the patient.   Lonia SkinnerLeslie K Griffith CreekSofia, PA-C 12/06/15 [id number]    Loren Raceravid Yelverton, MD 12/12/15 [id number]

## 2015-12-06 NOTE — ED Triage Notes (Signed)
Pt presents with 2 week h/o abscess to buttocks and abdomen.  Pt reports both areas are draining.

## 2015-12-06 NOTE — ED Notes (Signed)
Declined W/C at D/C and was escorted to lobby by RN. 

## 2016-02-28 ENCOUNTER — Emergency Department (HOSPITAL_COMMUNITY)
Admission: EM | Admit: 2016-02-28 | Discharge: 2016-02-29 | Disposition: A | Discharge status: Home / Self Care | Payer: Medicaid Other | Attending: Emergency Medicine | Admitting: Emergency Medicine

## 2016-02-28 ENCOUNTER — Encounter (HOSPITAL_COMMUNITY): Payer: Self-pay | Admitting: Emergency Medicine

## 2016-02-28 DIAGNOSIS — F17200 Nicotine dependence, unspecified, uncomplicated: Secondary | ICD-10-CM | POA: Insufficient documentation

## 2016-02-28 DIAGNOSIS — J020 Streptococcal pharyngitis: Secondary | ICD-10-CM | POA: Insufficient documentation

## 2016-02-28 LAB — RAPID STREP SCREEN (MED CTR MEBANE ONLY): Streptococcus, Group A Screen (Direct): POSITIVE — AB

## 2016-02-28 MED ORDER — PENICILLIN G BENZATHINE 1200000 UNIT/2ML IM SUSP
1.2000 10*6.[IU] | Freq: Once | INTRAMUSCULAR | Status: AC
  Administered 2016-02-28: 1.2 10*6.[IU] via INTRAMUSCULAR
  Filled 2016-02-28: qty 2

## 2016-02-28 MED ORDER — ACETAMINOPHEN 325 MG PO TABS
650.0000 mg | ORAL_TABLET | Freq: Once | ORAL | Status: AC | PRN
  Administered 2016-02-28: 650 mg via ORAL

## 2016-02-28 MED ORDER — DEXAMETHASONE SODIUM PHOSPHATE 10 MG/ML IJ SOLN
10.0000 mg | Freq: Once | INTRAMUSCULAR | Status: AC
  Administered 2016-02-28: 10 mg via INTRAMUSCULAR
  Filled 2016-02-28: qty 1

## 2016-02-28 MED ORDER — ACETAMINOPHEN 325 MG PO TABS
ORAL_TABLET | ORAL | Status: AC
  Filled 2016-02-28: qty 2

## 2016-02-28 NOTE — ED Notes (Signed)
Pt stable, ambulatory, states understanding of discharge instructions 

## 2016-02-28 NOTE — Discharge Instructions (Signed)
Return if you have difficulty swallowing or other problems.  Take tylenol regularly for fever and pain. You can alternate with ibuprofen if you do not have any problems with ibuprofen. Use salt water gargles and Chloraseptic Spray for pain.

## 2016-02-28 NOTE — ED Provider Notes (Signed)
[facility] Provider Note   CSN: [id number] Arrival date & time: 02/28/16  2136  By signing my name below, I, [name], attest that this documentation has been prepared under the direction and in the presence of [facility] Neese, NP-C. Electronically Signed: [name], ED Scribe. 02/28/16. 11:46 PM.  History   Chief Complaint Chief Complaint  Patient presents with  . Fever  . Sore Throat    The history is provided by the patient. No language interpreter was used.  Fever   This is a new problem. The current episode started 2 days ago. The problem occurs constantly. The problem has been gradually worsening. The maximum temperature noted was 101 to 101.9 F. The temperature was taken using an oral thermometer. Associated symptoms include headaches and sore throat. Pertinent negatives include no vomiting, no congestion and no cough. She has tried acetaminophen for the symptoms. The treatment provided no relief.  Sore Throat  This is a new problem. The current episode started 2 days ago. The problem occurs constantly. The problem has been gradually worsening. Associated symptoms include headaches. She has tried acetaminophen for the symptoms. The treatment provided no relief.  HPI Comments: Destiny White is a 40 y.o. female who presents to the Emergency Department complaining of gradually worsening sore throat and fever tmax 102 F onset two days ago. Pt reports associated nausea and nausea. Pt has worsening pain with swallowing. She is able to tolerate secretions. She has been taking Tylenol for her symptoms to no relief. She denies chills, congestion, ear pain, trouble swallowing, cough, or vomiting. She is allergic to morphine.   Past Medical History:  Diagnosis Date  . Anxiety   . Obesity     There are no active problems to display for this patient.   Past Surgical History:  Procedure Laterality Date  . CESAREAN SECTION    . CHOLECYSTECTOMY    . HERNIA REPAIR       OB History    No data available       Home Medications    Prior to Admission medications   Medication Sig Start Date End Date Taking? Authorizing Provider  cephALEXin (KEFLEX) 500 MG capsule Take 1 capsule (500 mg total) by mouth 4 (four) times daily. 12/06/15   Elson AreasLeslie K Sofia, PA-C    Family History No family history on file.  Social History Social History  Substance Use Topics  . Smoking status: Current Every Day Smoker  . Smokeless tobacco: Never Used  . Alcohol use Yes     Allergies   Morphine and Morphine and related   Review of Systems Review of Systems  Constitutional: Positive for fever. Negative for chills.  HENT: Positive for sore throat. Negative for congestion, ear pain and trouble swallowing.   Respiratory: Negative for cough.   Gastrointestinal: Positive for nausea. Negative for vomiting.  Neurological: Positive for headaches.     Physical Exam Updated Vital Signs BP 106/67 (BP Location: Left Arm)   Pulse 104   Temp 99.7 F (37.6 C) (Oral)   Resp 18   LMP 02/16/2016   SpO2 96%   Physical Exam  Constitutional: She is oriented to person, place, and time. She appears well-developed and well-nourished.  HENT:  Head: Normocephalic and atraumatic.  Right Ear: Tympanic membrane normal.  Left Ear: Tympanic membrane normal.  Mouth/Throat: Uvula is midline and mucous membranes are normal. No trismus in the jaw. Oropharyngeal exudate, posterior oropharyngeal edema and posterior oropharyngeal erythema present. Tonsillar exudate.  Pt with  kissing tonsils  Eyes: Conjunctivae are normal.  Neck: Normal range of motion. Neck supple.  Anterior cervical lymphadenitis   Cardiovascular: Normal rate and regular rhythm.   Pulmonary/Chest: Effort normal.  Abdominal: There is no CVA tenderness.  Musculoskeletal: Normal range of motion.  Lymphadenopathy:    She has cervical adenopathy.  Neurological: She is alert and oriented to person, place, and time.   Skin: Skin is warm and dry.  Psychiatric: She has a normal mood and affect. Her behavior is normal.  Nursing note and vitals reviewed.  ED Treatments / Results  DIAGNOSTIC STUDIES: Oxygen Saturation is 96% on RA, normal by my interpretation.    COORDINATION OF CARE: 6:01 PM-Discussed treatment plan.    Labs (all labs ordered are listed, but only abnormal results are displayed) Labs Reviewed  RAPID STREP SCREEN (NOT AT [facility]) - Abnormal; Notable for the following:       Result Value   Streptococcus, Group A Screen (Direct) POSITIVE (*)    All other components within normal limits    Radiology No results found.  Procedures Procedures (including critical care time)  Medications Ordered in ED Medications  acetaminophen (TYLENOL) tablet 650 mg (650 mg Oral Given 02/28/16 2154)  penicillin g benzathine (BICILLIN LA) 1200000 UNIT/2ML injection 1.2 Million Units (1.2 Million Units Intramuscular Given 02/28/16 2354)  dexamethasone (DECADRON) injection 10 mg (10 mg Intramuscular Given 02/28/16 2354)     Initial Impression / Assessment and Plan / ED Course  I have reviewed the triage vital signs and the nursing notes.  Pertinent lab results that were available during my care of the patient were reviewed by me and considered in my medical decision making (see chart for details).  Clinical Course    Pt febrile with tonsillar exudate, cervical lymphadenopathy, & dysphagia; diagnosis of strep. Treated in the Ed with decadron 10 mg x1,  Bicillin LA 1.2 M/U IM.  NSAIDs. Pt appears mildly dehydrated, discussed importance of water rehydration. Presentation non concerning for PTA or infxn spread to soft tissue. No trismus or uvula deviation. Specific return precautions discussed. Pt able to drink water in ED without difficulty with intact air way. Recommended PCP follow up.   Final Clinical Impressions(s) / ED Diagnoses   Final diagnoses:  Strep pharyngitis  I personally performed the  services described in this documentation, which was scribed in my presence. The recorded information has been reviewed and is accurate.   New Prescriptions Discharge Medication List as of 02/28/2016 11:50 PM       Janne NapoleonHope M Neese, NP 02/29/16 1802    Glynn OctaveStephen Rancour, MD 03/01/16 [phone]

## 2016-02-28 NOTE — ED Triage Notes (Signed)
Pt states "i'm hurting all over really bad, it hurts very bad to swallow. I had a fever earlier of 102." Pt has fever of 101.9. Pt took tylenol this morning at noon.

## 2016-11-19 ENCOUNTER — Emergency Department (HOSPITAL_COMMUNITY)
Admission: EM | Admit: 2016-11-19 | Discharge: 2016-11-19 | Disposition: A | Discharge status: Home / Self Care | Payer: Medicaid Other | Attending: Emergency Medicine | Admitting: Emergency Medicine

## 2016-11-19 ENCOUNTER — Encounter (HOSPITAL_COMMUNITY): Payer: Self-pay | Admitting: Emergency Medicine

## 2016-11-19 DIAGNOSIS — K047 Periapical abscess without sinus: Secondary | ICD-10-CM

## 2016-11-19 DIAGNOSIS — H1033 Unspecified acute conjunctivitis, bilateral: Secondary | ICD-10-CM

## 2016-11-19 DIAGNOSIS — F17210 Nicotine dependence, cigarettes, uncomplicated: Secondary | ICD-10-CM | POA: Insufficient documentation

## 2016-11-19 MED ORDER — POLYMYXIN B-TRIMETHOPRIM 10000-0.1 UNIT/ML-% OP SOLN
1.0000 [drp] | OPHTHALMIC | Status: DC
  Administered 2016-11-19: 1 [drp] via OPHTHALMIC
  Filled 2016-11-19: qty 10

## 2016-11-19 MED ORDER — AMOXICILLIN 500 MG PO CAPS
500.0000 mg | ORAL_CAPSULE | Freq: Once | ORAL | Status: AC
  Administered 2016-11-19: 500 mg via ORAL
  Filled 2016-11-19: qty 1

## 2016-11-19 MED ORDER — AMOXICILLIN 400 MG/5ML PO SUSR: 500.0000 mg | Freq: Three times a day (TID) | ORAL | 0 refills | Status: AC

## 2016-11-19 NOTE — ED Provider Notes (Signed)
[facility] Provider Note   CSN: [id number] Arrival date & time: 11/19/16  [id number]  By signing my name below, I, Rosana Fretana Waskiewicz, attest that this documentation has been prepared under the direction and in the presence of non-physician practitioner, Garlon HatchetSanders, Ankur Snowdon M., PA-C. Electronically Signed: Rosana Fretana Waskiewicz, ED Scribe. 11/19/16. 10:32 AM.  History   Chief Complaint Chief Complaint  Patient presents with  . Eye Problem  . Facial Swelling   The history is provided by the patient. No language interpreter was used.   HPI Comments: Destiny White is a 41 y.o. female who presents to the Emergency Department complaining of bilateral eye pain onset today. Pt was seen 4 days ago and given an erythromycin ointment for her right eye, which has not helped. She developed symptoms in her left eye today. No contact lenses use, glasses as needed. She reports associated facial swelling (right cheek) as well. Pt has had contact with pink eye-- grandson. Pt denies dental pain, visual disturbance or any other complaints at this time.  Past Medical History:  Diagnosis Date  . Anxiety   . Obesity     There are no active problems to display for this patient.   Past Surgical History:  Procedure Laterality Date  . CESAREAN SECTION    . CHOLECYSTECTOMY    . HERNIA REPAIR      OB History    No data available       Home Medications    Prior to Admission medications   Medication Sig Start Date End Date Taking? Authorizing Provider  cephALEXin (KEFLEX) 500 MG capsule Take 1 capsule (500 mg total) by mouth 4 (four) times daily. 12/06/15   Elson AreasSofia, Leslie K, PA-C    Family History No family history on file.  Social History Social History  Substance Use Topics  . Smoking status: Current Every Day Smoker  . Smokeless tobacco: Never Used  . Alcohol use Yes     Allergies   Morphine and Morphine and related   Review of Systems Review of Systems  HENT: Positive for facial swelling.  Negative for dental problem.   Eyes: Positive for pain and redness. Negative for visual disturbance.  All other systems reviewed and are negative.    Physical Exam Updated Vital Signs BP 126/88   Pulse 78   Temp 98.6 F (37 C) (Oral)   Resp 18   Ht 5\' 8"  (1.727 m)   Wt 267 lb (121.1 kg)   SpO2 98%   BMI 40.60 kg/m   Physical Exam  Constitutional: She is oriented to person, place, and time. She appears well-developed and well-nourished.  HENT:  Head: Normocephalic and atraumatic.  Nose: Nose normal.  Mouth/Throat: Oropharynx is clear and moist.  Teeth largely in very poor dentition, almost all teeth are broken and/or decayed, right upper molar is broken and flush with gum line; surrounding gingiva swollen with extension into right upper cheek, area is locally tender, no fluctuance or drainage, handling secretions appropriately, no trismus, no neck swelling, normal phonation without stridor  Eyes: Pupils are equal, round, and reactive to light. EOM are normal. Right conjunctiva is injected. Left conjunctiva is injected.  No lid edema or erythema, both conjunctiva are injected and tearing, no discharge; EOMs fully intact and non-painful; no evidence of FB  Neck: Normal range of motion.  Cardiovascular: Normal rate, regular rhythm and normal heart sounds.   Pulmonary/Chest: Effort normal and breath sounds normal. No respiratory distress. She has no wheezes.  Abdominal: Soft. Bowel  sounds are normal. There is no tenderness. There is no rebound.  Musculoskeletal: Normal range of motion.  Neurological: She is alert and oriented to person, place, and time.  Skin: Skin is warm and dry.  Psychiatric: She has a normal mood and affect.  Nursing note and vitals reviewed.    ED Treatments / Results  DIAGNOSTIC STUDIES: Oxygen Saturation is 98% on RA, normal by my interpretation.   COORDINATION OF CARE: 10:27 AM-Discussed next steps with pt including switching ointment to drops and  following up with an eye doctor. Pt verbalized understanding and is agreeable with the plan.   Labs (all labs ordered are listed, but only abnormal results are displayed) Labs Reviewed - No data to display  EKG  EKG Interpretation None       Radiology No results found.  Procedures Procedures (including critical care time)  Medications Ordered in ED Medications  trimethoprim-polymyxin b (POLYTRIM) ophthalmic solution 1 drop (not administered)  amoxicillin (AMOXIL) capsule 500 mg (not administered)     Initial Impression / Assessment and Plan / ED Course  I have reviewed the triage vital signs and the nursing notes.  Pertinent labs & imaging results that were available during my care of the patient were reviewed by me and considered in my medical decision making (see chart for details).  41 year old female here with bilateral eye pain. Seen at [facility] 4 days ago it started on erythromycin ointment but has not had any relief. Her grandson was recently diagnosed with conjunctivitis as well. On exam she has no lid edema or erythema suggestive of orbital or preseptal cellulitis. EOMs are fully intact and nonpainful. Both conjunctiva are injected and tearing. This does seem consistent with bacterial conjunctivitis. Will switch to Polytrim drops-- given here and instructed on continued home use. She wears glasses for reading only, no corrective lenses. Will have her follow-up with her eye doctor.  Patient is also developed some right upper cheek swelling. Her dentition is extremely poor and nearly all of her teeth are broken or decayed. Her right upper molars broken and flushed with gumline with significant surrounding gingival swelling with extension into the right upper cheek. There is no neck swelling. She was able to handle her secretions well. Normal phonation without stridor. No signs or symptoms concerning for Ludwig's angina. Her cheek swelling is likely secondary to dental  infection. Will start on amoxicillin. Will have her follow-up with dentist.  Patient understands to return here for any new or worsening symptoms. She was discharged home in stable condition.  Final Clinical Impressions(s) / ED Diagnoses   Final diagnoses:  Acute bacterial conjunctivitis of both eyes  Dental infection    New Prescriptions New Prescriptions   AMOXICILLIN (AMOXIL) 400 MG/5ML SUSPENSION    Take 6.3 mLs (500 mg total) by mouth 3 (three) times daily.   I personally performed the services described in this documentation, which was scribed in my presence. The recorded information has been reviewed and is accurate.   Garlon Hatchet, PA-C 11/19/16 1108    Rolland Porter, MD 11/20/16 1045

## 2016-11-19 NOTE — ED Notes (Signed)
Pt wears corrective lenses but does not have them with her. No contacts.

## 2016-11-19 NOTE — ED Triage Notes (Signed)
Onset one week ago family member diagnosed with pink eye then patient right eye became red seen Doctor given ointment to place on eyelid. Since then patient left eye red and today while eat developed right face pain and swelling 8/10 achy. Airway intact bilateral equal chest rise and fall.

## 2016-11-19 NOTE — ED Notes (Signed)
Called pharmacy for the second time, for eye gtts.

## 2016-11-19 NOTE — ED Notes (Signed)
Pt started 3 days ago with bilateral eye redness, irritation and constant tearing. [facility] and treated with Erythromycin ointment. Pt states there has been NO improvement. Also reports, her baby had "pink eye" a week prior to this.

## 2016-11-19 NOTE — Discharge Instructions (Signed)
Continue using polytrim drops every 2 hours while you are awake for the next week.  Use in both eyes. Make sure to keep eyes clean with warm washcloth.  Do NOT reuse.  Recommend to wash your pillowcases often as well to prevent spread of infection. Take the amoxicillin for your dental infection. Follow-up with your eye doctor if not improving. Recommend to follow-up with local dentist about your tooth.  Dr. Michiel SitesKoelling is on call if you need to see him.

## 2017-06-27 IMAGING — CT CT ABD-PELV W/ CM
2 of 5 series · 15 of 46 positions shown, 17 images · IV contrast (Omni 300)
Comparison: None.

CLINICAL DATA: 39-year-old female with mid abdominal pain.

EXAM:
CT ABDOMEN AND PELVIS WITH CONTRAST
TECHNIQUE: Multidetector CT imaging of the abdomen and pelvis was performed
using the standard protocol following bolus administration of
intravenous contrast.
CONTRAST:  100mL OMNIPAQUE IOHEXOL 300 MG/ML  SOLN

[Series 2: a/p w/ 5mm · axial · 0.86mm/px · z∈[-573,-93]mm · 12 of 108 slices shown, 14 images]
[im 6/108  soft-tissue]
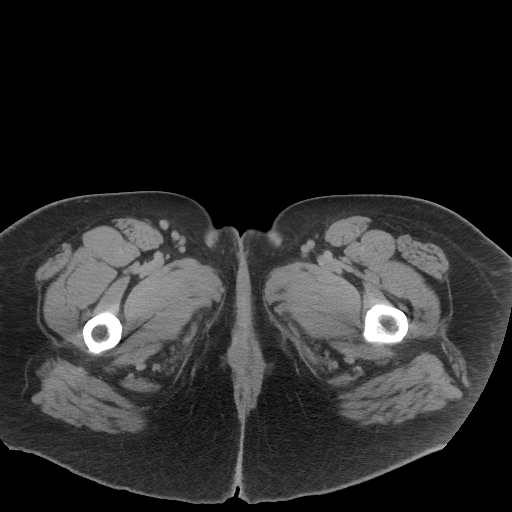
[im 6/108  bone]
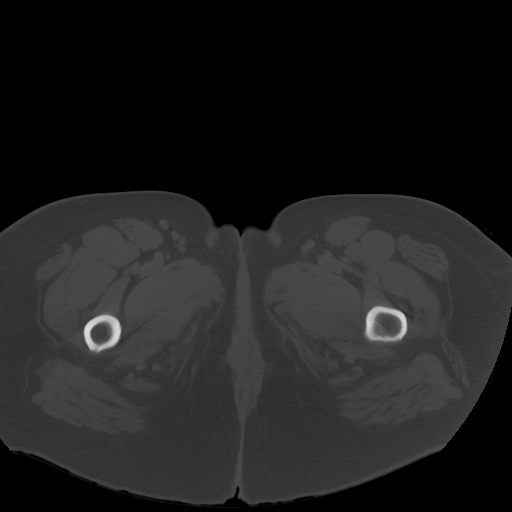
[im 17/108  soft-tissue]
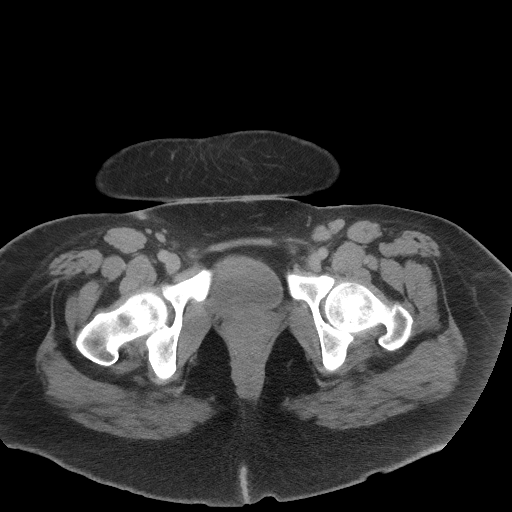
[im 23/108  soft-tissue]
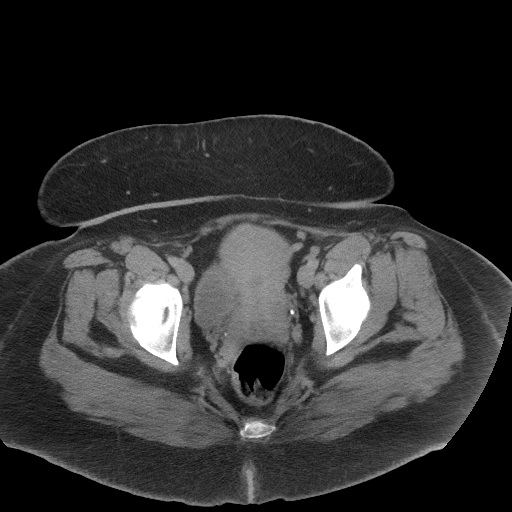
[im 34/108  soft-tissue]
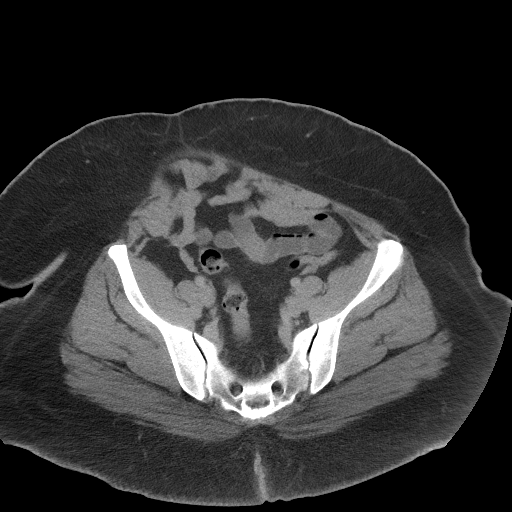
[im 40/108  soft-tissue]
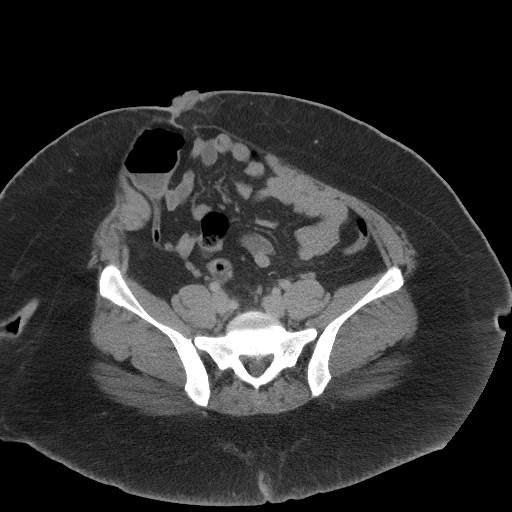
[im 51/108  soft-tissue]
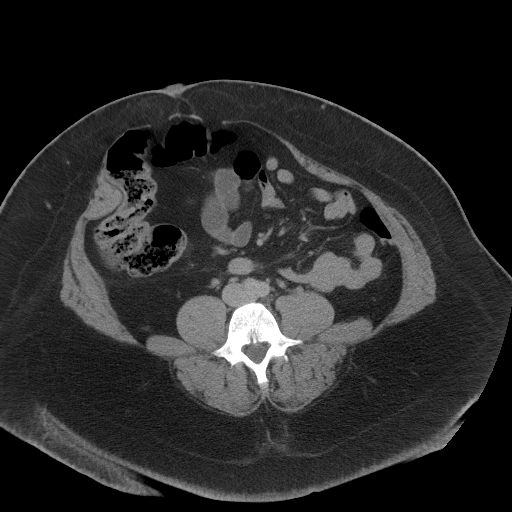
[im 57/108  soft-tissue]
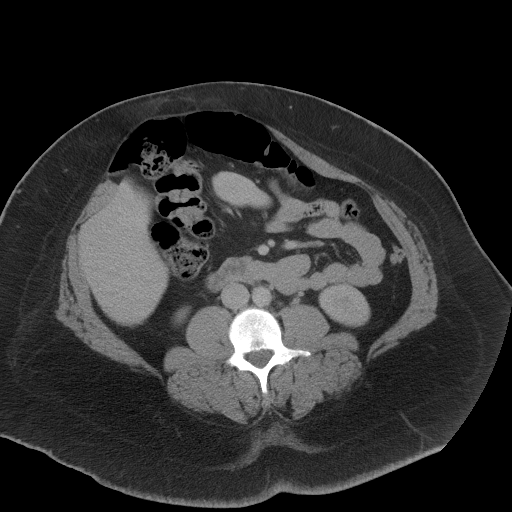
[im 68/108  soft-tissue]
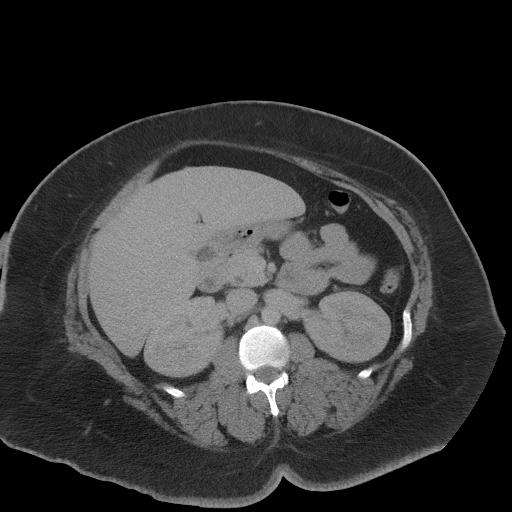
[im 74/108  soft-tissue]
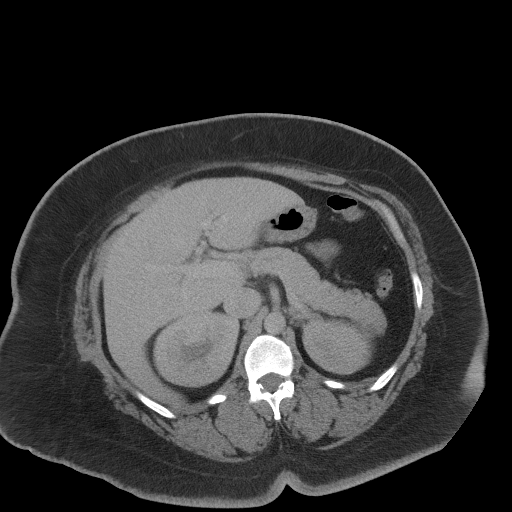
[im 74/108  bone]
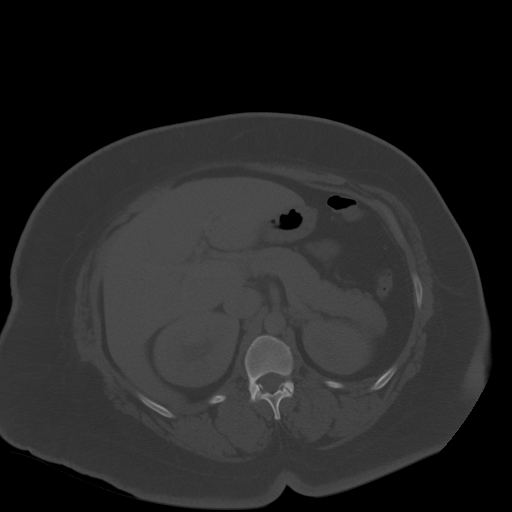
[im 85/108  soft-tissue]
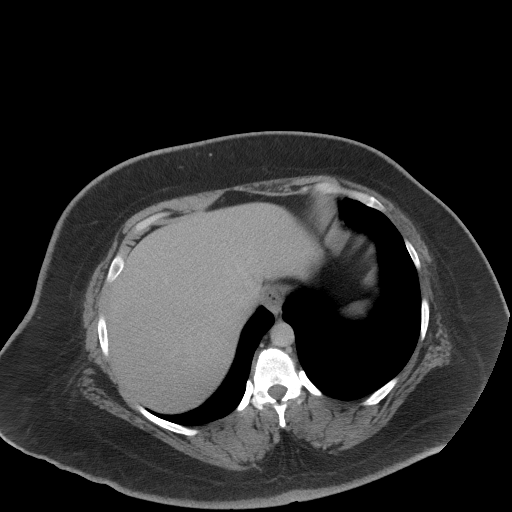
[im 91/108  soft-tissue]
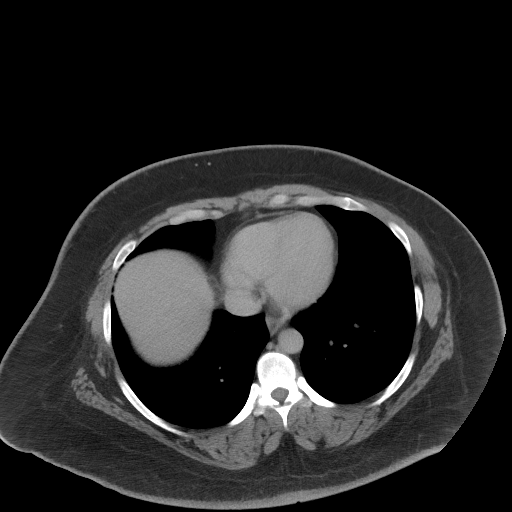
[im 102/108  soft-tissue]
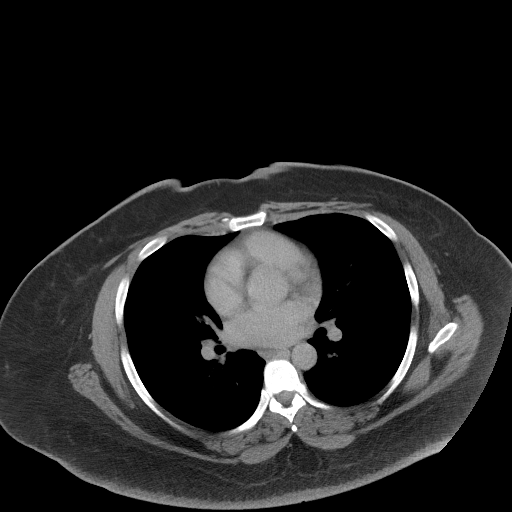

[Series 5: a/p w/ cor · coronal · 0.90mm/px · 3 of 156 slices shown]
[im 52/156  soft-tissue]
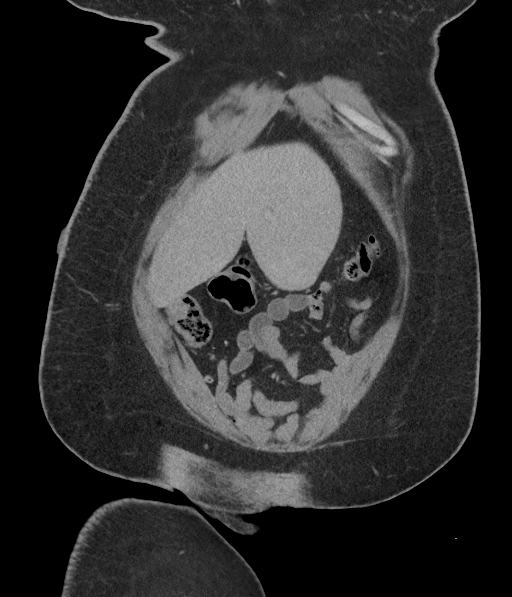
[im 69/156  soft-tissue]
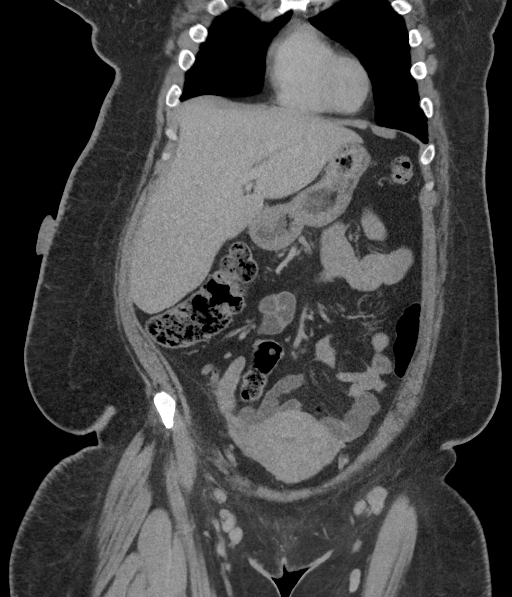
[im 87/156  soft-tissue]
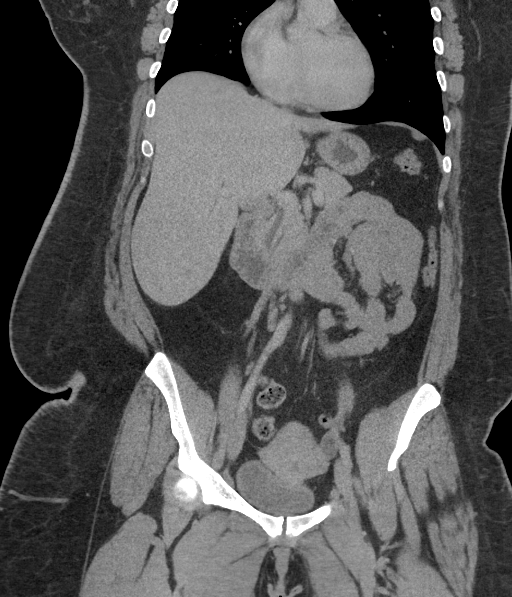

[15 of 46 positions shown; findings below may reference images not displayed]

FINDINGS: There is a 4 mm nodule in the right middle lobe (series 2, image 9).
The visualized lung bases are otherwise clear. No intra-abdominal
free air or free fluid.

Cholecystectomy. The liver, pancreas, spleen, and adrenal glands
appear unremarkable. There is mild focal caliectasis in the upper
pole of the right kidney bowel without diffuse hydronephrosis. No
definite stone identified. Underlying obstructing lesion is not
excluded. Ultrasound is recommended for further evaluation. The left
kidney is unremarkable. The visualized ureters and urinary bladder
appear grossly unremarkable. The uterus is anteverted and appears
unremarkable.

Multiple small umbilical and supraumbilical ventral hernia
containing small amount of mesenteric fat noted. There is focal
protrusion of anterior wall of the transverse colon in this
supraumbilical hernia defect. There is no evidence of bowel
obstruction at this site. There is a 4.5 x 7.4 x 2.9 cm lobulated
soft tissue mass involving the skin and subcutaneous soft tissues of
the periumbilical region. This does not appear to be connected to
the herniated fat and therefore an inflamed or strangulated
herniated fat is less likely. Correlation with clinical exam is
recommended to exclude a neoplastic process. Ultrasound may provide
better evaluation of this lesion. Tissue sampling may be necessary
if clinically indicated.

There is no evidence of bowel obstruction or inflammation. Normal
appendix.

The abdominal aorta and IVC appear unremarkable. No portal venous
gas identified. There is no adenopathy. Top-normal bilateral
inguinal lymph nodes noted. The osseous structures are intact.
IMPRESSION: Small fat containing umbilical and supraumbilical hernias with focal
protrusion of a short segment of the anterior wall of the transverse
colon into the supraumbilical hernia defect. No evidence of bowel
obstruction or inflammation. Normal appendix.

Lobulated and exophytic soft tissue mass involving the skin and
subcutaneous soft tissues of the periumbilical region. A neoplastic
process not excluded. Correlation with clinical exam recommended.
Ultrasound may provide additional information.

Focal mild caliectasis of the upper pole of the right kidney.
Further evaluation with ultrasound is recommended.

## 2017-08-25 IMAGING — DX DG CHEST 2V
2 series · 2 of 2 positions shown · non-contrast
Comparison: None.

CLINICAL DATA: Chest pain

EXAM:
CHEST  2 VIEW

[chest pa]
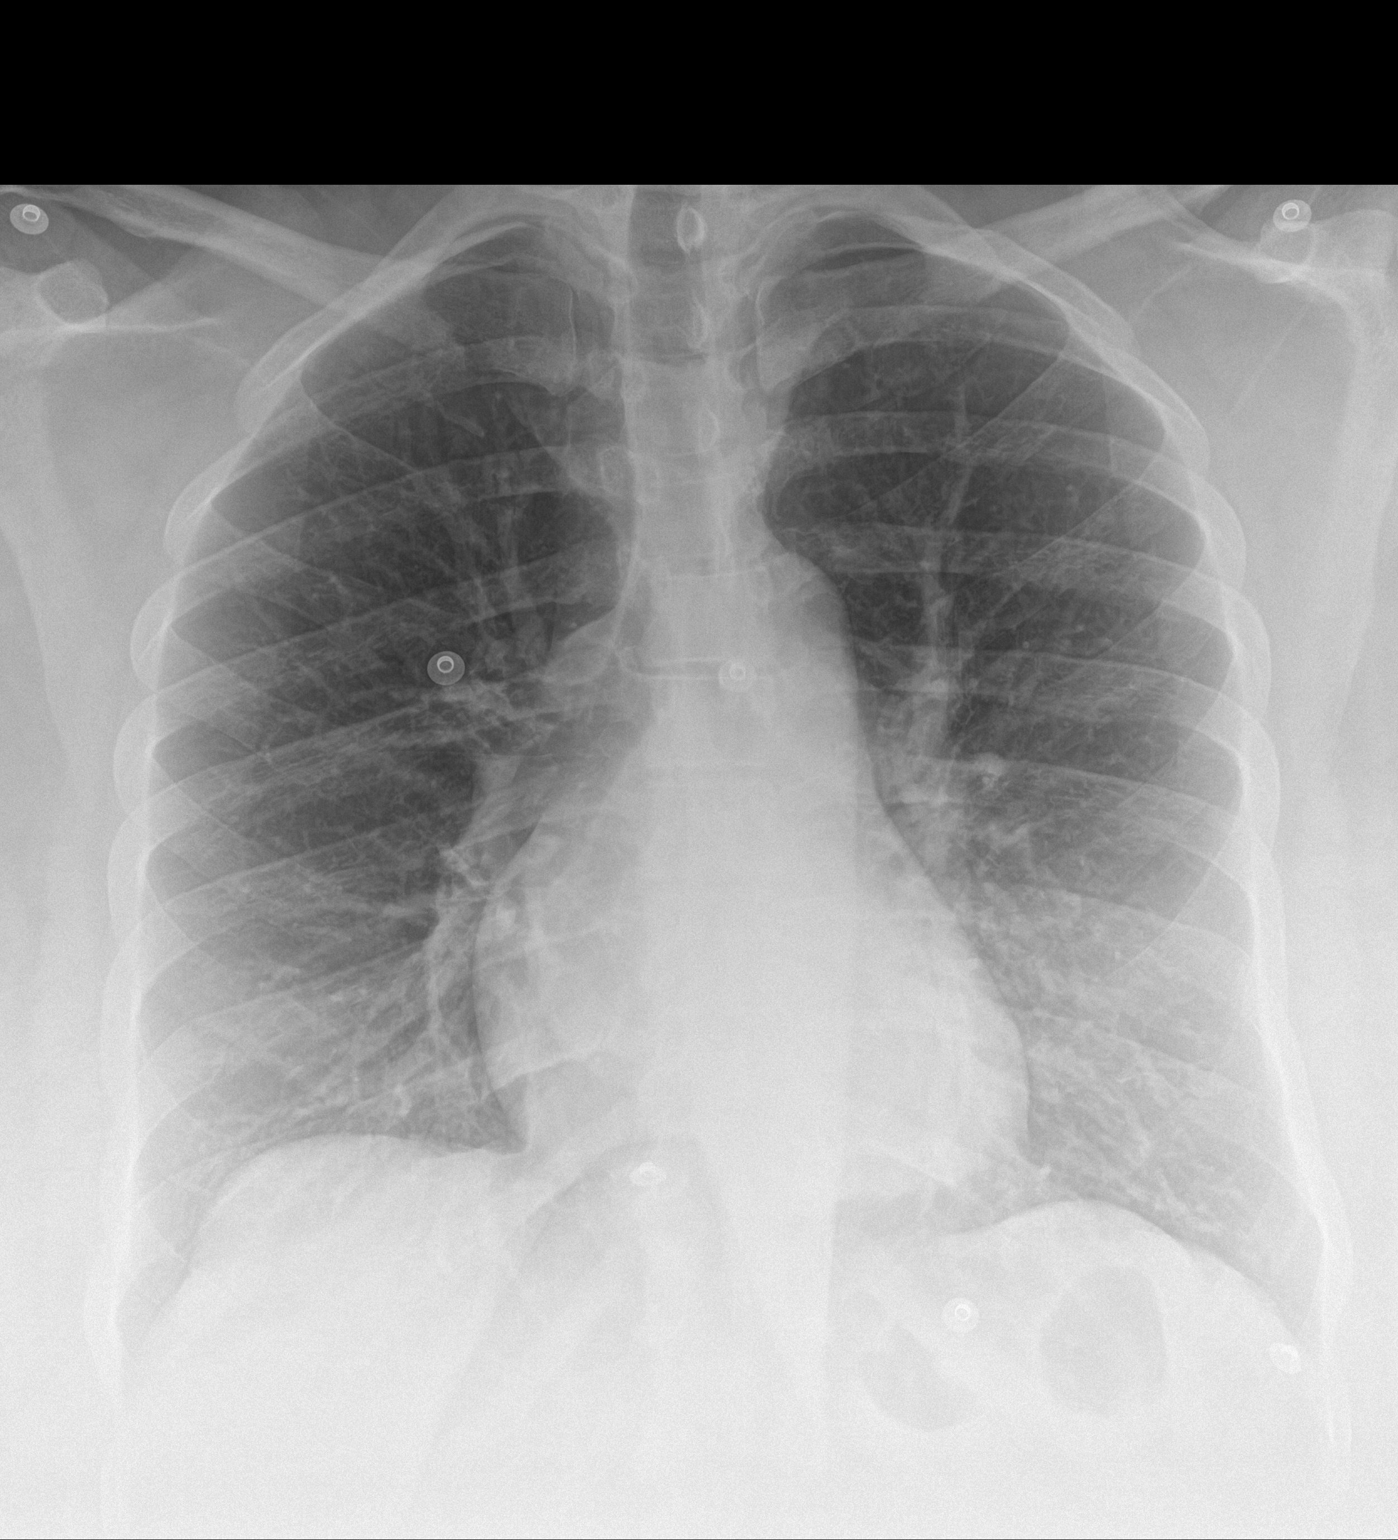

[chest lat]
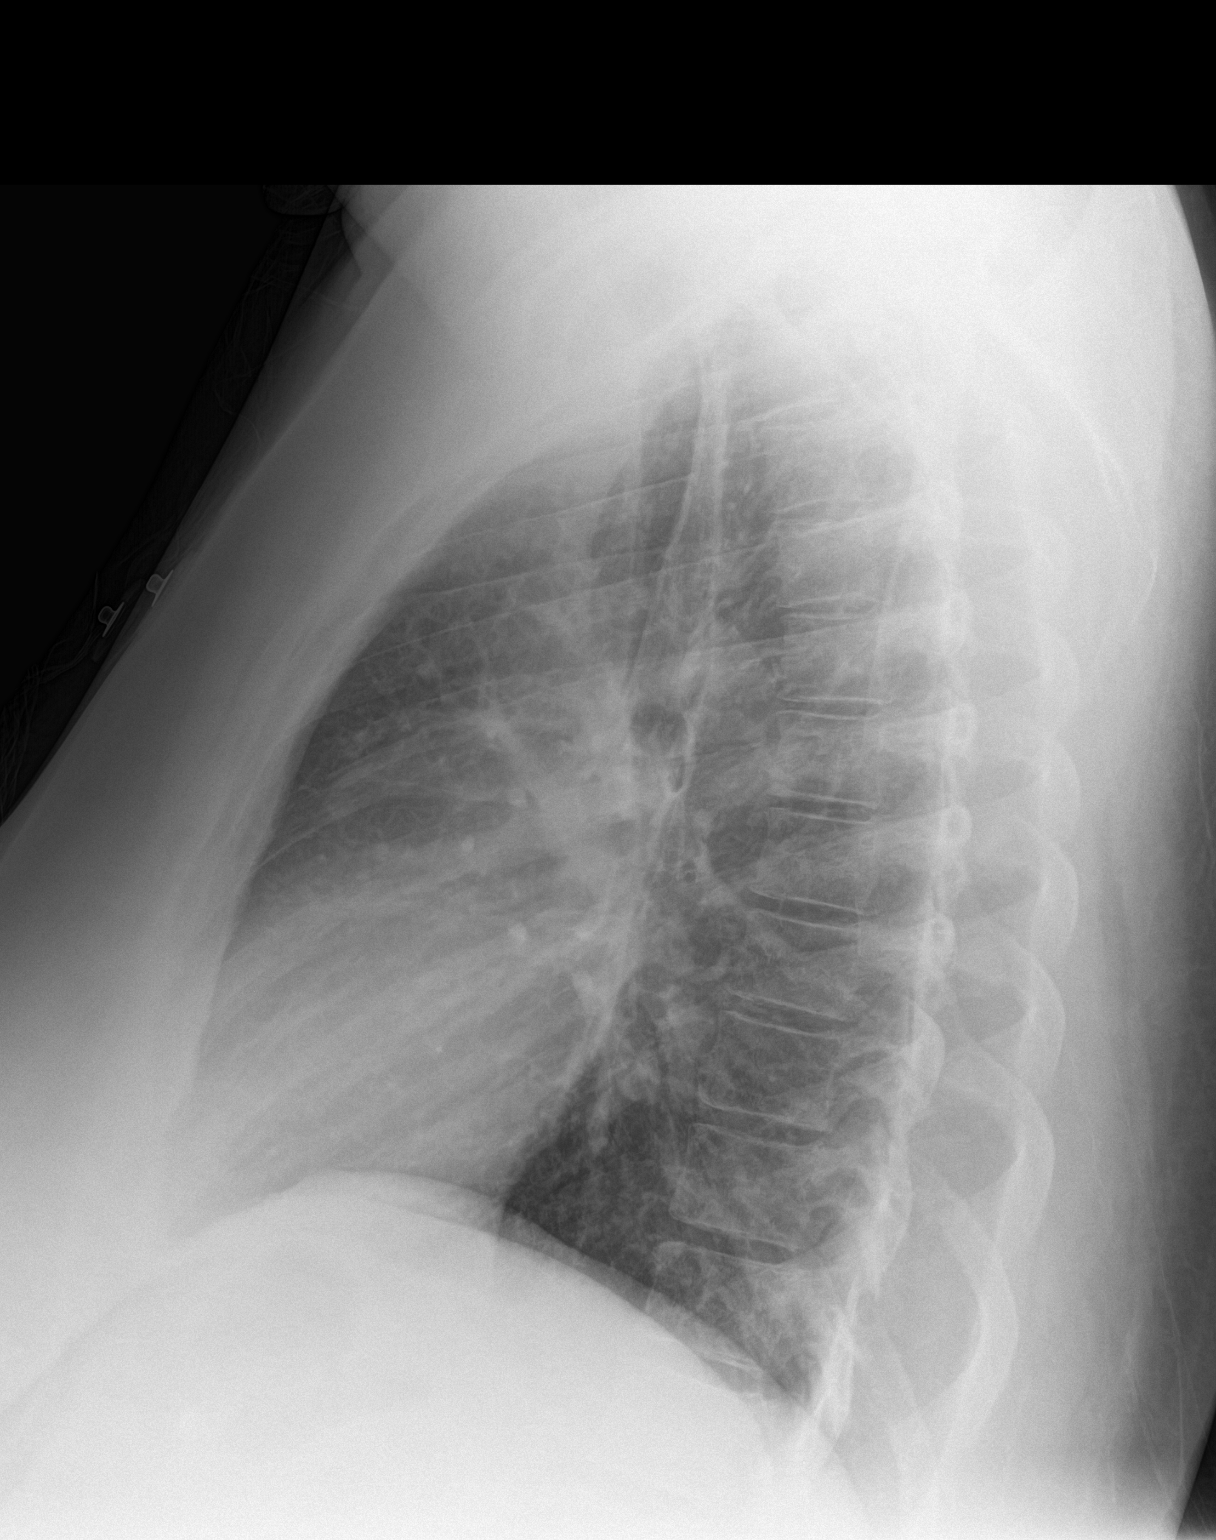

[2 of 2 positions shown; findings below may reference images not displayed]

FINDINGS: Normal heart size. Normal mediastinal contour. No pneumothorax. No
pleural effusion. Lungs appear clear, with no acute consolidative
airspace disease and no pulmonary edema.
IMPRESSION: No active cardiopulmonary disease.
